# Patient Record
Sex: Female | Born: 1945 | Race: White | Marital: Single
Health system: Northeastern US, Academic
[De-identification: ages and names within clinical notes are randomized; demographics above are authoritative.]

---

## 2019-08-17 ENCOUNTER — Encounter: Payer: Self-pay | Admitting: Gastroenterology

## 2019-08-24 ENCOUNTER — Ambulatory Visit: Payer: Self-pay | Admitting: Psychiatry

## 2019-08-24 ENCOUNTER — Encounter: Payer: Self-pay | Admitting: Gastroenterology

## 2019-08-24 DIAGNOSIS — F064 Anxiety disorder due to known physiological condition: Secondary | ICD-10-CM

## 2019-08-24 NOTE — Progress Notes (Signed)
[facility]  Department of Telepsychiatry Intake    Date of Consult: 08/24/2019  Medical Provider Requesting Consult (name and title): Lorane Gell, MSW  Facility Name: [facility]  Facility Admit Date: 09/05/2017  Type of Consult: Video  Care Providers present during video consult: Lorane Gell, MSW    Patient Name: Arryana Tolleson  Date of Birth: 01/16/1946  MRN: [id number]    Reason for Consult: medication review and increased anxiety     HPI: Tanaka Gillen is a 74 year old woman who was transferred from [facility] to [facility] for long term care after she was admitted for not being able to care for herself in the community. Carolie has been diagnosed with bipolar disorder and schizoaffective disorder in the past which I agree with based on her description of previous psychotic and manic episodes. Sibbie is not currently on any psychiatric medications and there have been no concerns for depression, mania, or psychosis since she arrived at the facility last year. Annice Pih stated that Jaime has been having anxiety and insomnia related to not being able to breathe as well since the facility blocked residents from using nebulizers due to COVID-19. Jazell has been using inhalers instead which have not been as effective. Annice Pih reported no concerns regarding cognitive impairment or behavioral concerns. She requires assistance with most ADLs.     On interview, Vianna confirmed a history of bipolar and psychotic symptoms, but stated that she has not experienced depression, mania, or psychosis "in many years" and had many allergies to medications and chose to not continue them due to side effects. She stated that she was previously living at home but was unable to care for herself so she is now in long term care. She reported increased anxiety and waking up frequently due to shortness of breath, and denied anxiety prior to not being able to use the nebulizers. She also quit smoking last year and  has gained 20 pounds which she is upset about. She has not been using her hearing aids because she is receiving ear drops, so most of the interview was relayed by Annice Pih so Nataliah could hear me through the screen. Bunnie also stated that she needs glasses but her appointment was pended due to COVID-19. Ingra stated that she had a sleep study a few years ago and is uncertain if they recommended a CPAP or BiPAP. She reported a history of psychiatric hospitalizations. She stated that she had one overdose attempt 40+ years ago but denied active suicidal thoughts. She reported a list of allergies to antipsychotic medications. She stated that she previously engaged in outpatient mental health treatment. No reported history of drug or alcohol abuse. No concern for cognitive impairment on interview.    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 O2 dependence, DM2, morbid obesity, HLD, hearing loss    Allergies: Thorazine, Stelazine, Risperidal, Seroquel    Psychiatric History: schizoaffective disorder, bipolar disorder, insomnia, anxiety     Psychosocial History: as noted in HPI     Pertinent Labs/Imaging: no significant lab results on     Psychiatric Testing: PHQ-9 was 11 - all related to not being able to breathe well     Current Medications:   Artificial tears  Biotene mouth wash  PRN Dulcolax  Fleet enema PRN  Flucticasone  PRN ibuprofen   Incruse Ellipta Aerosol Powder puffs  Lantus   Lipitor  Loteprednol  Melatonin 6mg  PO nightly  Milk of Magnesia PRN  Montelukast   Muscle Rub Cream PRN  Oxybutynin  ProAir puffs PRN  Symbicort puffs  Vitamin D3    Mental Status Exam:  APPEARANCE: appropriately dressed, appears to have fair hygiene, obese   ATTITUDE TOWARD INTERVIEWER: cooperative, polite, engages well  MOTOR ACTIVITY: not assessed  EYE CONTACT: direct, appropriate   SPEECH: mild speech impediment; normal amount and rate  AFFECT: euthymic  MOOD: "feeling more anxious because I can't breathe right"  THOUGHT PROCESS: organized, future  oriented, logical   THOUGHT CONTENT: no evidence of delusions or suicidal ideation   PERCEPTION: no evidence of hallucinations   ORIENTATION: AAOx3  CONCENTRATION: good  MEMORY: no concerns for cognitive impairment on interview   COGNITIVE FUNCTION: average intellence  JUDGMENT: good  IMPULSE CONTROL: intact  INSIGHT: good    Assessment: Blessing Zaucha presents with symptoms consistent with anxiety and difficulty sleeping secondary to severe COPD and increased shortness of breath since she has been unable to utilize nebulizers due to facility [id number] policy. No evidence of psychosis, mania, depression, or cognitive impairment on interview. I would NOT recommend starting a medication for sleep since it may decrease her ability to wake up when she is short of breath which could result in harm. I would actually recommend discontinuing the melatonin since it is not effective for sleep and increases her risk of being sedated where she might not be able to wake up if she does have shortness of breath. I would strongly recommend using a spacer for her inhaler and training staff on how to use the spacer, which may help get the inhaler medication into her lungs more effectively. I would also recommend a sleep study to see if she has obstructive sleep apnea to see if she requires a CPAP or BiPAP. Her shortness of breath is clearly causing significant impairment, so it may be worth speaking with administration to see if they can allow her to use the nebulizers to improve her quality of life.     Diagnostic Impression:     ICD-10-CM ICD-9-CM   1. Anxiety disorder due to general medical condition  F06.4 293.84       Recommendations:   1. Consider using a spacer for her inhalers if not using already and train staff on how to use the spacer     2. Discontinue melatonin    3. Do NOT start a medication for insomnia    4. Consider sleep study to evaluate for OSA to determine if she requires a CPAP or BiPAP    5. Follow-up with  psychiatry as needed      Hilton Cork,  [facility] Geriatric Psychiatry Fellow

## 2023-11-08 DEATH — deceased

## 5897-10-07 DEATH — deceased
